# Patient Record
Sex: Female | Born: 1960 | Race: White | Hispanic: No | Marital: Single | Smoking: Never smoker
Health system: Southern US, Community
[De-identification: ages and names within clinical notes are randomized; demographics above are authoritative.]

## PROBLEM LIST (undated history)

## (undated) DIAGNOSIS — M810 Age-related osteoporosis without current pathological fracture: Secondary | ICD-10-CM

## (undated) DIAGNOSIS — F329 Major depressive disorder, single episode, unspecified: Secondary | ICD-10-CM

## (undated) DIAGNOSIS — F32A Depression, unspecified: Secondary | ICD-10-CM

## (undated) DIAGNOSIS — K219 Gastro-esophageal reflux disease without esophagitis: Secondary | ICD-10-CM

---

## 2012-11-15 ENCOUNTER — Emergency Department (INDEPENDENT_AMBULATORY_CARE_PROVIDER_SITE_OTHER): Payer: BC Managed Care – PPO

## 2012-11-15 ENCOUNTER — Emergency Department (INDEPENDENT_AMBULATORY_CARE_PROVIDER_SITE_OTHER)
Admission: EM | Admit: 2012-11-15 | Discharge: 2012-11-15 | Disposition: A | Discharge status: Home / Self Care | Payer: BC Managed Care – PPO | Source: Home / Self Care | Attending: Family Medicine | Admitting: Family Medicine

## 2012-11-15 ENCOUNTER — Encounter: Payer: Self-pay | Admitting: *Deleted

## 2012-11-15 DIAGNOSIS — R0781 Pleurodynia: Secondary | ICD-10-CM

## 2012-11-15 DIAGNOSIS — M549 Dorsalgia, unspecified: Secondary | ICD-10-CM

## 2012-11-15 DIAGNOSIS — S7000XA Contusion of unspecified hip, initial encounter: Secondary | ICD-10-CM

## 2012-11-15 DIAGNOSIS — S2249XA Multiple fractures of ribs, unspecified side, initial encounter for closed fracture: Secondary | ICD-10-CM

## 2012-11-15 DIAGNOSIS — S7001XA Contusion of right hip, initial encounter: Secondary | ICD-10-CM

## 2012-11-15 DIAGNOSIS — M25559 Pain in unspecified hip: Secondary | ICD-10-CM

## 2012-11-15 DIAGNOSIS — W19XXXA Unspecified fall, initial encounter: Secondary | ICD-10-CM

## 2012-11-15 DIAGNOSIS — R079 Chest pain, unspecified: Secondary | ICD-10-CM

## 2012-11-15 HISTORY — DX: Depression, unspecified: F32.A

## 2012-11-15 HISTORY — DX: Major depressive disorder, single episode, unspecified: F32.9

## 2012-11-15 HISTORY — DX: Age-related osteoporosis without current pathological fracture: M81.0

## 2012-11-15 MED ORDER — HYDROCODONE-ACETAMINOPHEN 5-325 MG PO TABS: ORAL_TABLET | ORAL | Status: DC

## 2012-11-15 NOTE — ED Provider Notes (Signed)
History     CSN: [id number]  Arrival date & time 11/15/12  1138   First MD Initiated Contact with Patient 11/15/12 1256      Chief Complaint  Patient presents with  . Abdominal Pain  . Back Pain     HPI Comments: Three days ago patient fell against a table edge, striking her left upper back.  She has had persistent pain in that area as well as her left hip.  No shortness of breath.  She has a history of osteoporosis and broken 3 ribs in the same location previously.  Patient is a 51 y.o. female presenting with chest pain. The history is provided by the patient.  Chest Pain Episode onset: 3 days ago. Chest pain occurs frequently. The chest pain is unchanged. The pain is associated with breathing. The severity of the pain is moderate. The quality of the pain is described as aching. The pain does not radiate. Chest pain is worsened by deep breathing and certain positions. Primary symptoms include abdominal pain. Pertinent negatives for primary symptoms include no  fever, no fatigue, no syncope, no shortness of breath, no cough, no wheezing, no palpitations, no nausea and no vomiting. She tried NSAIDs for the symptoms. Risk factors: osteoporosis.     Past Medical History  Diagnosis Date  . Osteoporosis   . Depression     History reviewed. No pertinent past surgical history.  Family History  Problem Relation Age of Onset  . Cancer Father     History  Substance Use Topics  . Smoking status: Never Smoker   . Smokeless tobacco: Never Used  . Alcohol Use: No    OB History    Grav Para Term Preterm Abortions TAB SAB Ect Mult Living                  Review of Systems  Constitutional: Negative for fever and fatigue.  Respiratory: Negative for cough, shortness of breath and wheezing.   Cardiovascular: Positive for chest pain. Negative for palpitations and syncope.  Gastrointestinal: Positive for abdominal pain. Negative for nausea and vomiting.  All other systems reviewed and are negative.    Allergies  Review of patient's allergies indicates no known allergies.  Home Medications   Current Outpatient Rx  Name  Route  Sig  Dispense  Refill  . HYDROCODONE-ACETAMINOPHEN 5-325 MG PO TABS      Take one by mouth at bedtime as needed for pain   12 tablet   0     BP 115/78  Pulse 109  Temp 98.6 F (37 C) (Oral)  Resp 18  Ht 5\' 5"  (1.651 m)  Wt 135 lb 8 oz (61.462 kg)  BMI 22.55 kg/m2  SpO2 99%  Physical Exam  Nursing note and vitals reviewed. Constitutional: She is oriented to person, place, and time. She appears well-developed and well-nourished. No distress.  HENT:  Head: Atraumatic.  Mouth/Throat: Oropharynx is clear and moist.  Eyes: Conjunctivae normal are normal. Pupils are equal, round, and reactive to light.  Neck: Normal range of motion. Neck supple.  Cardiovascular: Normal heart sounds.   Pulmonary/Chest: Breath sounds normal.         Tenderness left posterior inferior ribs as noted on diagram.  Also  tenderness over thoracic spine as noted.  Abdominal: Soft. There is no tenderness.  Musculoskeletal: She exhibits tenderness.       Left hip: She exhibits decreased range of motion and tenderness. She exhibits normal strength, no bony tenderness, no swelling, no crepitus, no deformity and no laceration.       Legs:      Mild tenderness left lateral hip and decreased range of motion   Neurological: She is alert and oriented to person, place, and time.  Skin: Skin is warm.    ED Course  Procedures  none   Dg Ribs Unilateral W/chest Left  11/15/2012  *RADIOLOGY REPORT*  Clinical Data: Fall 3 days ago.  Pain and tenderness posteriorly over left ribs.  LEFT RIBS AND CHEST - 3+ VIEW  Comparison: None.  Findings: The lungs are clear  without focal infiltrate, edema, pneumothorax or pleural effusion.  Multiple nonacute right-sided rib fractures are evident.  On the left, nonacute fracture of the left sixth rib is evident.  There is more age indeterminate fracture identified in the anterolateral left third and fifth ribs, but these are probably also nonacute.  IMPRESSION: Probable nonacute fractures of the left third, fifth, and sixth ribs.  No definite acute left-sided rib fracture.  No left pneumothorax or pleural effusion.   Original Report Authenticated By: Kennith Center, M.D.    Dg Thoracic Spine 2 View  11/15/2012  *RADIOLOGY REPORT*  Clinical Data: Back pain after fall  THORACIC SPINE - 2 VIEW  Comparison: None.  Findings: Two-view exam of the thoracic spine shows no evidence for an acute fracture.  No subluxation.  Intervertebral disc spaces are preserved.  Frontal film shows no abnormal paraspinal line.  IMPRESSION: No evidence for acute fracture in the thoracic spine.   Original Report Authenticated By: Kennith Center, M.D.    Dg Hip Complete Right  11/15/2012  *RADIOLOGY REPORT*  Clinical Data: Fall 2 days ago.  Worsening right hip pain.  RIGHT HIP - COMPLETE 2+ VIEW  Comparison:  None.   Findings:  There is no evidence of hip fracture or dislocation. There is no evidence of arthropathy or other focal bone abnormality.  IMPRESSION: Negative.   Original Report Authenticated By: Myles Rosenthal, M.D.      1. Rib pain on left side; non-acute fractures of left third, fifth, and sixth ribs                                2. Contusion of right hip       MDM  Rib belt applied.  Rx for Lortab at bedtime. Apply ice pack several times daily.  May take Tylenol daytime for pain. If shortness of breath develops, proceed to ER Followup with Dr. Rodney Langton if not improving 2 weeks.  Red flags discussed.        Lattie Haw, MD 11/18/12 1126

## 2012-11-15 NOTE — ED Notes (Signed)
Patient c/o abdominal, back, and hip pain x 3 days. Pt states was horse playing with sister and sister kicked her in abdomen and she landed on glass tabletop.  Has tried OTC Ibuprofen.

## 2014-03-23 IMAGING — CR DG RIBS W/ CHEST 3+V*L*
3 series · 3 of 3 positions shown · non-contrast
Comparison: None.

CLINICAL DATA: Fall 3 days ago.  Pain and tenderness posteriorly
over left ribs.

LEFT RIBS AND CHEST - 3+ VIEW

[view not recorded (1 of 3)]
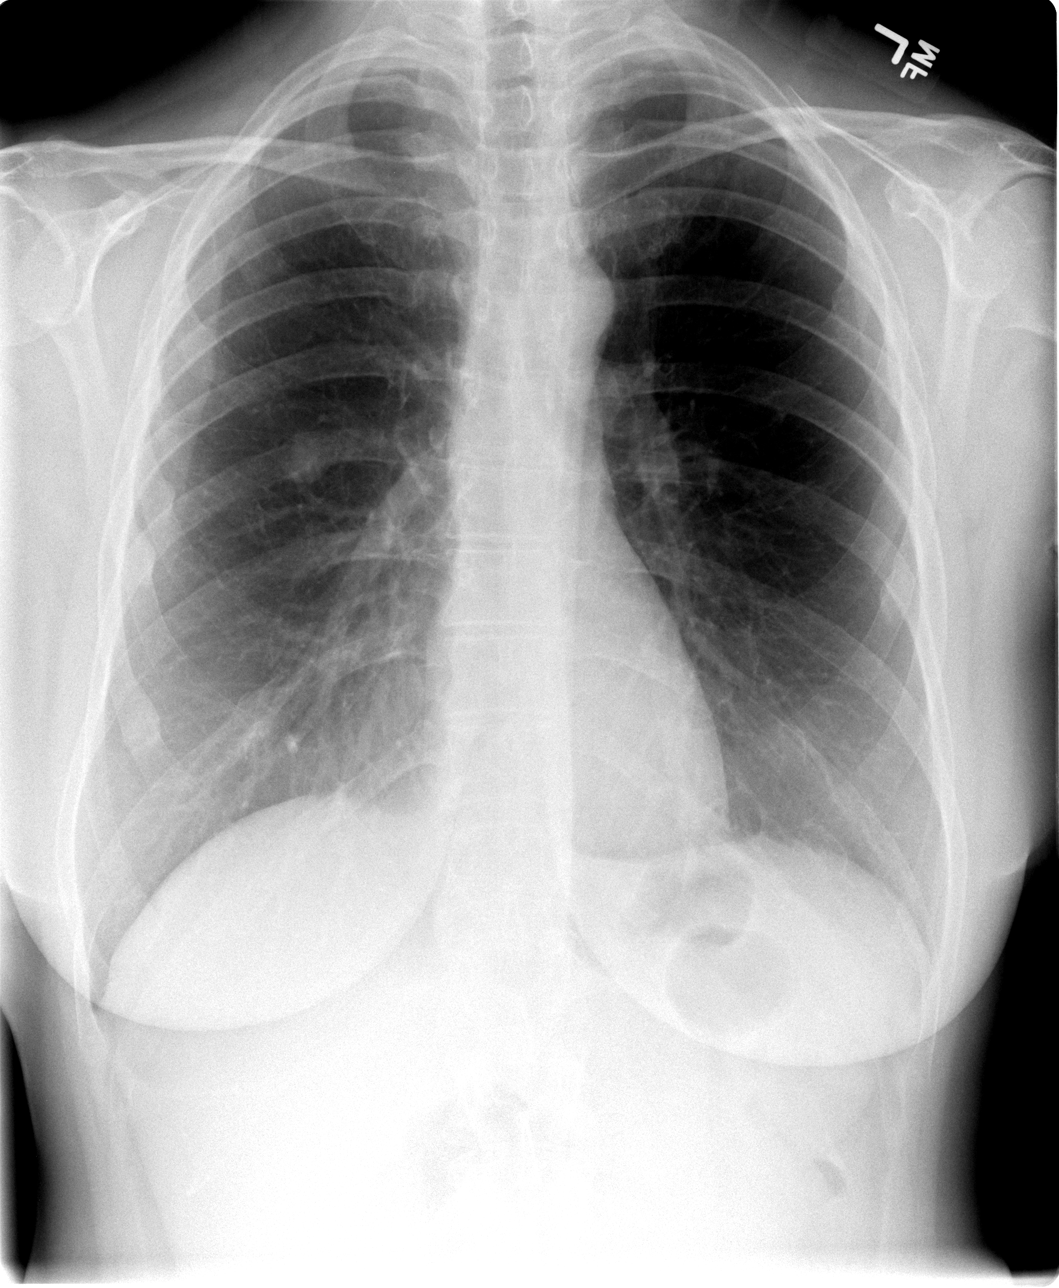

[view not recorded (2 of 3)]
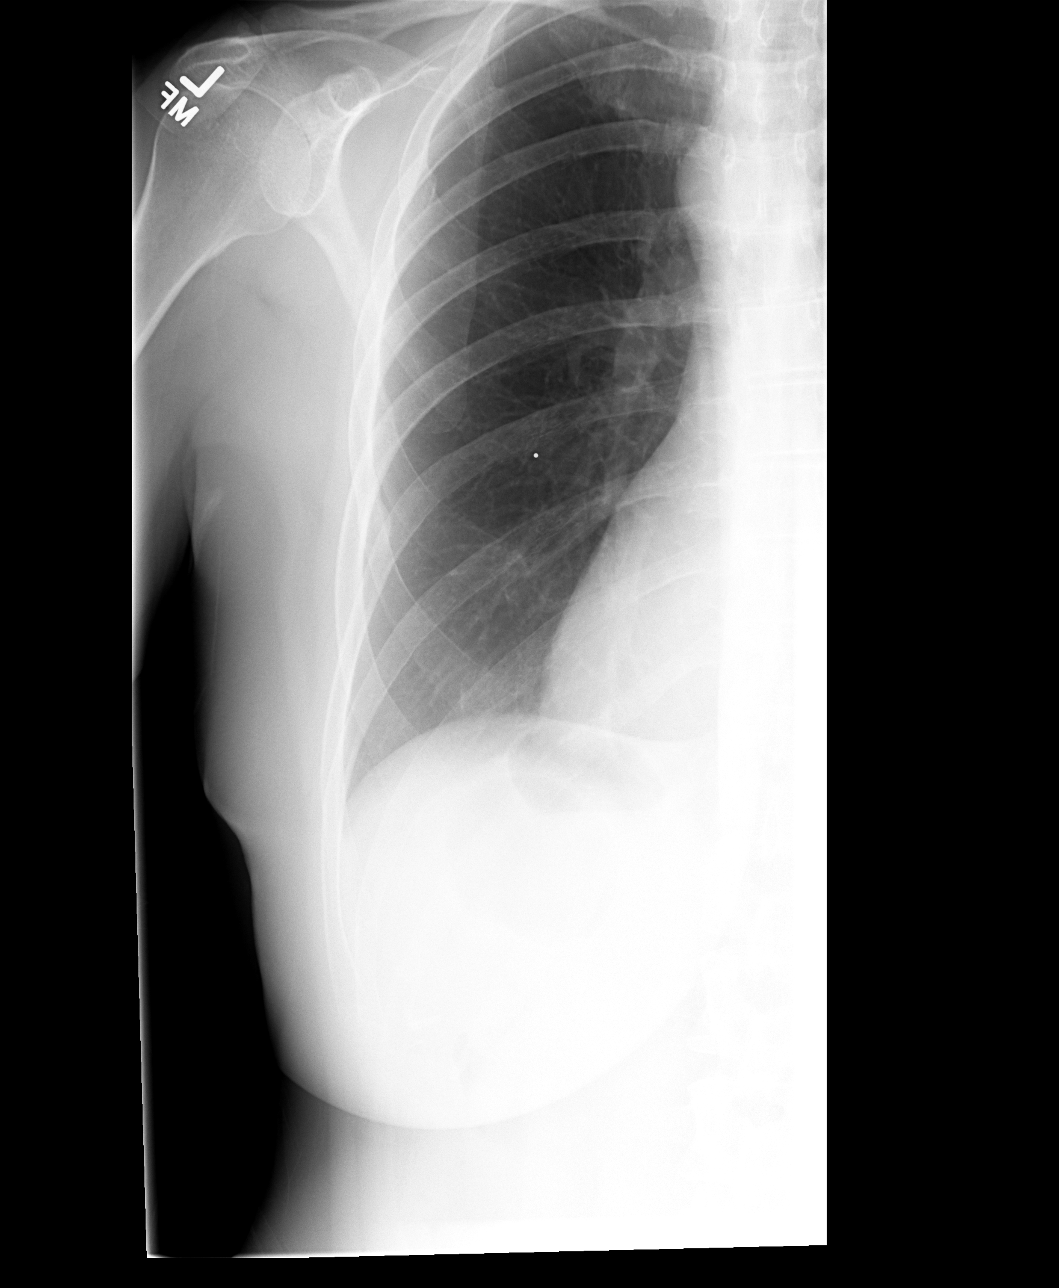

[view not recorded (3 of 3)]
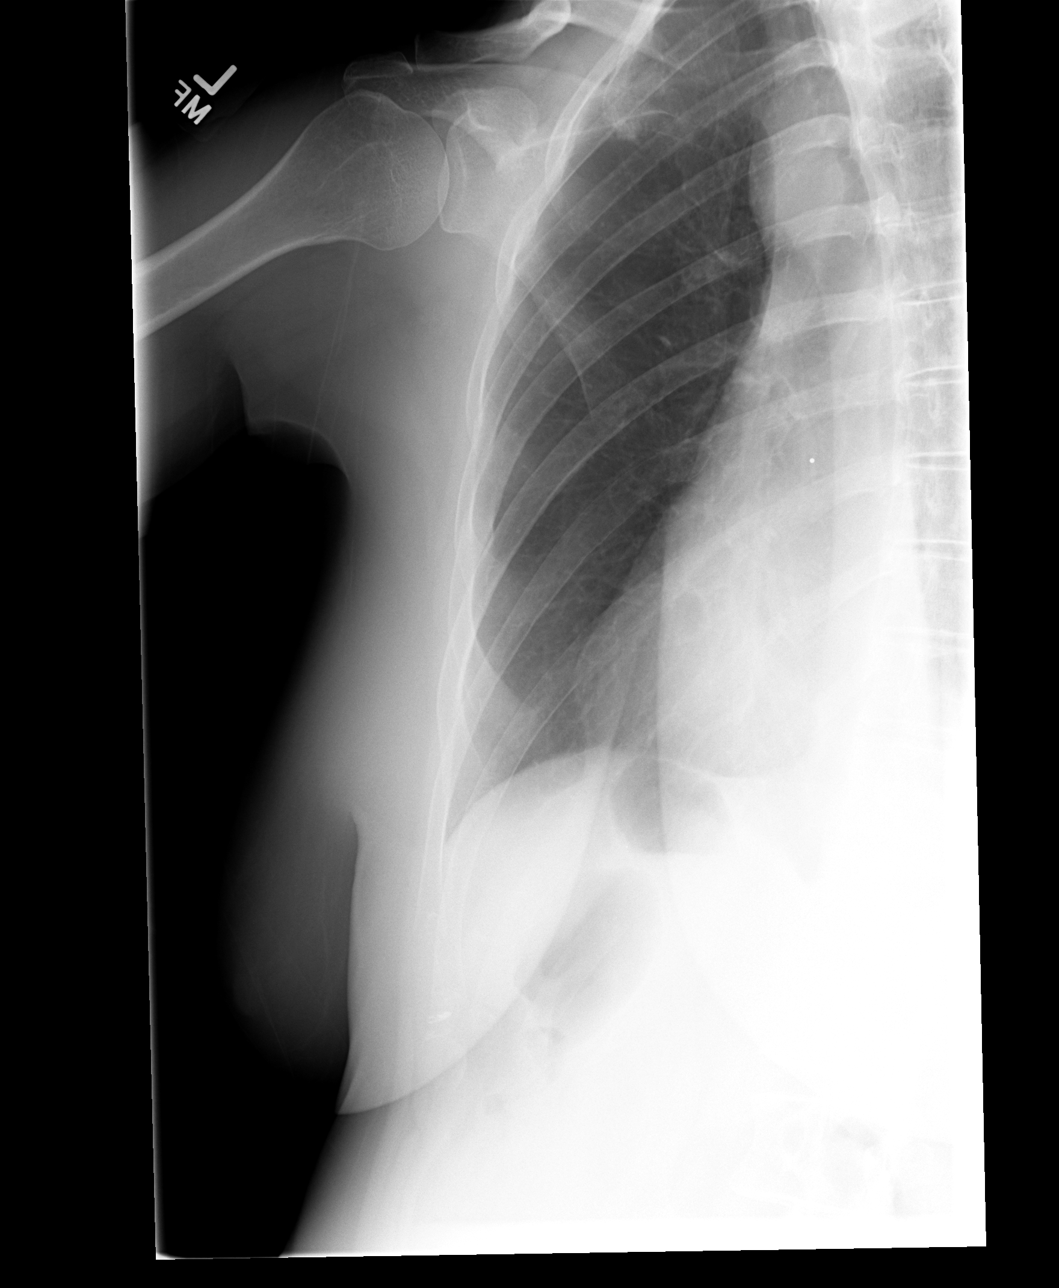

[3 of 3 positions shown; findings below may reference images not displayed]

FINDINGS: The lungs are clear without focal infiltrate, edema,
pneumothorax or pleural effusion.  Multiple nonacute right-sided
rib fractures are evident.  On the left, nonacute fracture of the
left sixth rib is evident.  There is more age indeterminate
fracture identified in the anterolateral left third and fifth ribs,
but these are probably also nonacute.
IMPRESSION: Probable nonacute fractures of the left third, fifth, and sixth
ribs.  No definite acute left-sided rib fracture.  No left
pneumothorax or pleural effusion.

## 2015-08-02 ENCOUNTER — Encounter: Payer: Self-pay | Admitting: *Deleted

## 2015-08-02 ENCOUNTER — Emergency Department
Admission: EM | Admit: 2015-08-02 | Discharge: 2015-08-02 | Disposition: A | Discharge status: Home / Self Care | Payer: BLUE CROSS/BLUE SHIELD | Source: Home / Self Care | Attending: Family Medicine | Admitting: Family Medicine

## 2015-08-02 DIAGNOSIS — L740 Miliaria rubra: Secondary | ICD-10-CM | POA: Diagnosis not present

## 2015-08-02 HISTORY — DX: Gastro-esophageal reflux disease without esophagitis: K21.9

## 2015-08-02 MED ORDER — PREDNISONE 20 MG PO TABS: 20.0000 mg | ORAL_TABLET | Freq: Two times a day (BID) | ORAL | Status: AC

## 2015-08-02 NOTE — ED Provider Notes (Signed)
CSN: [id number]     Arrival date & time 08/02/15  8588 History   First MD Initiated Contact with Patient 08/02/15 0857     Chief Complaint  Patient presents with  . Sunburn      HPI Comments: Patient was at the beach one week ago, and two days after arriving she developed pruritic erythematous rash on her forearms and dorsa of feet.  Even though she had been wearing a bathing suit, the rash only appeared on her arms and feet, and she reports that she had not spent much time in the sun.  She admits that she had applied sunscreen, with increased amount to forearms.  She feels well otherwise.  Patient is a 54 y.o. female presenting with rash. The history is provided by the patient.  Rash Location: forearms and dorsa of feet. Quality: dryness, itchiness and redness   Quality: not blistering, not burning, not draining, not painful, not peeling, not scaling, not swelling and not weeping   Severity:  Mild Onset quality:  Sudden Duration:  6 days Timing:  Constant Progression:  Unchanged Chronicity:  New Context: sun exposure   Context: not animal contact, not chemical exposure, not exposure to similar rash, not hot tub use, not medications, not new detergent/soap and not plant contact   Relieved by:  Nothing Ineffective treatments:  Topical steroids Associated symptoms: fatigue   Associated symptoms: no diarrhea, no fever, no headaches, no induration, no joint pain, no myalgias, no nausea, no periorbital edema, no sore throat, no URI and not wheezing     Past Medical History  Diagnosis Date  . Osteoporosis   . Depression   . GERD (gastroesophageal reflux disease)    Past Surgical History  Procedure Laterality Date  . Cesarean section     Family History  Problem Relation Age of Onset  . Cancer Father    Social History  Substance Use Topics  . Smoking status: Never Smoker   . Smokeless tobacco: Never Used  . Alcohol Use: No   OB History    No data available     Review of  Systems  Constitutional: Positive for fatigue. Negative for fever.  HENT: Negative for sore throat.   Respiratory: Negative for wheezing.   Gastrointestinal: Negative for nausea and diarrhea.  Musculoskeletal: Negative for myalgias and arthralgias.  Skin: Positive for rash.  Neurological: Negative for headaches.    Allergies  Review of patient's allergies indicates no known allergies.  Home Medications   Prior to Admission medications   Medication Sig Start Date End Date Taking? Authorizing Provider  buPROPion (WELLBUTRIN XL) 300 MG 24 hr tablet Take 300 mg by mouth daily.   Yes Historical Provider, MD  lamoTRIgine (LAMICTAL) 25 MG tablet Take 50 mg by mouth daily.   Yes Historical Provider, MD  pantoprazole (PROTONIX) 40 MG tablet Take 40 mg by mouth daily.   Yes Historical Provider, MD  predniSONE (DELTASONE) 20 MG tablet Take 1 tablet (20 mg total) by mouth 2 (two) times daily. Take with food. 08/02/15   Lattie Haw, MD   Meds Ordered and Administered this Visit  Medications - No data to display  BP 120/81 mmHg  Pulse 76  Temp(Src) 98.2 F (36.8 C) (Oral)  Resp 16  Ht 5\' 4"  (1.626 m)  Wt 127 lb (57.607 kg)  BMI 21.79 kg/m2  SpO2 100% No data found.   Physical Exam  Constitutional: She is oriented to person, place, and time. She appears well-developed and well-nourished.  She appears distressed.  HENT:  Head: Normocephalic.  Nose: Nose normal.  Mouth/Throat: Oropharynx is clear and moist.  Eyes: Conjunctivae are normal. Pupils are equal, round, and reactive to light.  Neck: Neck supple.  Cardiovascular: Normal heart sounds.   Pulmonary/Chest: Breath sounds normal.  Abdominal: There is no tenderness.  Musculoskeletal: She exhibits no edema.  Lymphadenopathy:    She has no cervical adenopathy.  Neurological: She is alert and oriented to person, place, and time.  Skin: Skin is warm and dry. Rash noted. Rash is macular. Rash is not vesicular. There is erythema.      Forearms and dorsa of feet have splotchy and confluent macular erythema without warmth, swelling, or tenderness to palpation.  Nursing note and vitals reviewed.   ED Course  Procedures  None    MDM   1. Heat rash (Miliara Rubra)   Prednisone burst. May take Benadryl at bedtime as needed for itching. May apply anhydrous lanolin.  Instructions given. Followup with dermatologist if not improved one week.   Lattie Haw, MD 08/02/15 [phone]

## 2015-08-02 NOTE — ED Notes (Signed)
Pt c/o sunburn on her arms, legs and feet x 6 days.

## 2015-08-02 NOTE — Discharge Instructions (Signed)
May take Benadryl at bedtime as needed for itching.  May apply anhydrous lanolin.   Heat Rash Heat rash (miliaria) is a skin irritation caused by heavy sweating during hot, humid weather. It results from blockage of the sweat glands on our body. It can occur at any age. It is most common in young children whose sweat ducts are still developing or are not fully developed. Tight clothing may make the condition worse. Heat rash can look like small blisters (vesicles) that break open easily with bathing or minimal pressure. These blisters are found most commonly on the face, upper trunk of children and the trunk of adults. It can also look like a red cluster of red bumps or pimples (pustules). These usually itch and can also sometimes burn. It is more likely to occur on the neck and upper chest, in the groin, under the breasts, and in elbow creases. HOME CARE INSTRUCTIONS   The best treatment for heat rash is to provide a cooler, less humid environment where sweating is much decreased.  Keep the affected area dry. Dusting powder (cornstarch powder, baby powder) may be used to increase comfort. Avoid using ointments or creams. They keep the skin warm and moist and may make the condition worse.  Treating heat rash is simple and usually does not require medical assistance. SEEK MEDICAL CARE IF:   There is any evidence of infection such as fever, redness, swelling.  There is discomfort such as pain.  The skin lesions do no resolve with cooler, dryer environment. MAKE SURE YOU:   Understand these instructions.  Will watch your condition.  Will get help right away if you are not doing well or get worse. Document Released: 10/24/2009 Document Revised: 01/28/2012 Document Reviewed: 10/24/2009 [facility] Patient Information 2015 [place], [place]. This information is not intended to replace advice given to you by your health care provider. Make sure you discuss any questions you have with your health care  provider.

## 2015-08-05 ENCOUNTER — Telehealth: Payer: Self-pay
